# Patient Record
Sex: Male | Born: 2000 | Hispanic: No | Marital: Single | Smoking: Never smoker
Health system: Southern US, Community
[De-identification: ages and names within clinical notes are randomized; demographics above are authoritative.]

## PROBLEM LIST (undated history)

## (undated) DIAGNOSIS — R030 Elevated blood-pressure reading, without diagnosis of hypertension: Secondary | ICD-10-CM

## (undated) DIAGNOSIS — J45909 Unspecified asthma, uncomplicated: Secondary | ICD-10-CM

## (undated) HISTORY — PX: NO PAST SURGERIES: SHX2092

---
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
## 2024-07-02 ENCOUNTER — Other Ambulatory Visit: Payer: Self-pay | Admitting: Student

## 2024-07-02 DIAGNOSIS — R748 Abnormal levels of other serum enzymes: Secondary | ICD-10-CM

## 2024-07-08 ENCOUNTER — Ambulatory Visit
Admission: RE | Admit: 2024-07-08 | Discharge: 2024-07-08 | Disposition: A | Discharge status: Home / Self Care | Source: Ambulatory Visit | Attending: Student | Admitting: Student

## 2024-07-08 DIAGNOSIS — R748 Abnormal levels of other serum enzymes: Secondary | ICD-10-CM | POA: Insufficient documentation
